# Patient Record
Sex: Female | Born: 2000 | Race: White | Hispanic: No | Marital: Single | Smoking: Never smoker
Health system: Southern US, Community
[De-identification: ages and names within clinical notes are randomized; demographics above are authoritative.]

## PROBLEM LIST (undated history)

## (undated) HISTORY — PX: KNEE SURGERY: SHX244

---
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
## 2019-04-27 ENCOUNTER — Other Ambulatory Visit: Payer: Self-pay

## 2019-04-27 MED ORDER — NEBIVOLOL HCL 10 MG PO TABS: 10.0000 mg | ORAL_TABLET | Freq: Every day | ORAL | 3 refills | Status: DC

## 2019-05-07 ENCOUNTER — Emergency Department
Admission: EM | Admit: 2019-05-07 | Discharge: 2019-05-07 | Disposition: A | Discharge status: Home / Self Care | Payer: BLUE CROSS/BLUE SHIELD | Attending: Emergency Medicine | Admitting: Emergency Medicine

## 2019-05-07 ENCOUNTER — Other Ambulatory Visit: Payer: Self-pay

## 2019-05-07 ENCOUNTER — Encounter: Payer: Self-pay | Admitting: Physician Assistant

## 2019-05-07 DIAGNOSIS — R0600 Dyspnea, unspecified: Secondary | ICD-10-CM | POA: Diagnosis present

## 2019-05-07 DIAGNOSIS — F410 Panic disorder [episodic paroxysmal anxiety] without agoraphobia: Secondary | ICD-10-CM | POA: Insufficient documentation

## 2019-05-07 NOTE — Discharge Instructions (Addendum)
Follow-up with your regular doctor.  Discussed increasing your Lexapro.  Or she may want to add a medication for you to take when you have a panic attack.

## 2019-05-07 NOTE — ED Triage Notes (Signed)
Pt comes via [facility] from home with c/o possible allergic reaction. Pt states she was at her desk at work and began to not feel well. Pt states she became cloudy headed and vision was disturbed. Pt states she has had same symptoms when she had allergic reaction.  Pt denies any recent food consumption prior to incident. Pt states she then got really anxious and thinks she was having a panic attack  Pt states she feel better now and not sure what caused this.  Pt states she took the benadryl when she thought she was having an allergic reaction.

## 2019-05-07 NOTE — ED Provider Notes (Signed)
[facility] Emergency Department Provider Note  ____________________________________________   First MD Initiated Contact with Patient 05/07/19 1620     (approximate)  I have reviewed the triage vital signs and the nursing notes.   HISTORY  Chief Complaint Allergic Reaction    HPI Sheri [name] is a 19 y.o. female presents emergency department via EMS from her workplace.  States she did know she was having allergic reaction or a panic attack.  States she did not feel well, got cloudy headed, went numb all over and had difficulty breathing.  No wheezing or rash.  She states she feels much better now after she took a Benadryl.  Denies any chest pain or shortness of breath.    History reviewed. No pertinent past medical history.  Patient Active Problem List   Diagnosis Date Noted  . Anxiety 07/20/2018  . Paroxysmal tachycardia ([place]) 05/05/2018  . Palpitations 05/05/2018  . Chest discomfort 05/05/2018    Past Surgical History:  Procedure Laterality Date  . KNEE SURGERY Right     Prior to Admission medications   Medication Sig Start Date End Date Taking? Authorizing Provider  Multiple Vitamins-Calcium (ONE-A-DAY WOMENS FORMULA) TABS Take 1 tablet by mouth daily.    [provider]  nebivolol (BYSTOLIC) 10 MG tablet Take 1 tablet (10 mg total) by mouth daily. 04/27/19   Minna Merritts, MD  propranolol (INDERAL) 10 MG tablet TAKE 1 TABLET BY MOUTH 3 (THREE) TIMES DAILY AS NEEDED (AS NEEDED FOR PAROXYSMAL TACHYCARDIA). 07/29/18   Minna Merritts, MD  SPRINTEC 28 0.25-35 MG-MCG tablet Take 1 tablet by mouth as directed. 06/27/18   [provider]    Allergies Blue dyes (parenteral)  History reviewed. No pertinent family history.  Social History Social History   Tobacco Use  . Smoking status: Never Smoker  . Smokeless tobacco: Never Used  Substance Use Topics  . Alcohol use: Not Currently  . Drug use: Not Currently     Review of Systems  Constitutional: No fever/chills Eyes: No visual changes. ENT: No sore throat. Respiratory: Denies cough Cardiovascular: Denies chest pain Gastrointestinal: Denies abdominal pain Genitourinary: Negative for dysuria. Musculoskeletal: Negative for back pain. Skin: Negative for rash. Psychiatric: no mood changes,     ____________________________________________   PHYSICAL EXAM:  VITAL SIGNS: ED Triage Vitals [05/07/19 1428]  Enc Vitals Group     BP 110/61     Pulse Rate 80     Resp 18     Temp 98 F (36.7 C)     Temp src      SpO2 100 %     Weight 110 lb (49.9 kg)     Height 5\' 5"  (1.651 m)     Head Circumference      Peak Flow      Pain Score 0     Pain Loc      Pain Edu?      Excl. in [place]?     Constitutional: Alert and oriented. Well appearing and in no acute distress. Eyes: Conjunctivae are normal.  Head: Atraumatic. Nose: No congestion/rhinnorhea. Mouth/Throat: Mucous membranes are moist.  No oral swelling Neck:  supple no lymphadenopathy noted Cardiovascular: Normal rate, regular rhythm. Heart sounds are normal Respiratory: Normal respiratory effort.  No retractions, lungs c t a  GU: deferred Musculoskeletal: FROM all extremities, warm and well perfused Neurologic:  Normal speech and language.  Skin:  Skin is warm, dry and intact. No rash noted.  No hives  noted Psychiatric: Mood and affect are normal. Speech and behavior are normal.  ____________________________________________   LABS (all labs ordered are listed, but only abnormal results are displayed)  Labs Reviewed - No data to display ____________________________________________   ____________________________________________  RADIOLOGY    ____________________________________________   PROCEDURES  Procedure(s) performed: No  Procedures    ____________________________________________   INITIAL IMPRESSION / ASSESSMENT AND PLAN / ED COURSE  Pertinent labs &  imaging results that were available during my care of the patient were reviewed by me and considered in my medical decision making (see chart for details).   Patient is 19 year old female presents emergency department with a questionable allergic reaction versus panic attack.  See HPI.  On physical exam patient is very stable there is no rash swelling or wheezing.  She does states she feels much better.  At this time I feel that she had a panic attack.  She be discharged with anxiety/panic disorder information.  She is to follow-up with her regular doctor to have her Lexapro increased from 5 mg to 10 mg.  She had also asked them if there is a medication she could take for when she does actually have a panic attack.  She was discharged stable condition    Sheri [name] was evaluated in Emergency Department on 05/07/2019 for the symptoms described in the history of present illness. She was evaluated in the context of the global COVID-19 pandemic, which necessitated consideration that the patient might be at risk for infection with the SARS-CoV-2 virus that causes COVID-19. Institutional protocols and algorithms that pertain to the evaluation of patients at risk for COVID-19 are in a state of rapid change based on information released by regulatory bodies including the CDC and federal and state organizations. These policies and algorithms were followed during the patient's care in the ED.   As part of my medical decision making, I reviewed the following data within the electronic MEDICAL RECORD NUMBER Nursing notes reviewed and incorporated, Old chart reviewed, Notes from prior ED visits and  Controlled Substance Database  ____________________________________________   FINAL CLINICAL IMPRESSION(S) / ED DIAGNOSES  Final diagnoses:  Panic attack      NEW MEDICATIONS STARTED DURING THIS VISIT:  New Prescriptions   No medications on file     Note:  This document was prepared using Dragon voice  recognition software and may include unintentional dictation errors.    Faythe Ghee, PA-C 05/07/19 1945    Emily Filbert, MD 05/07/19 2231

## 2019-07-01 ENCOUNTER — Other Ambulatory Visit: Payer: Self-pay | Admitting: Cardiovascular Disease

## 2019-11-08 ENCOUNTER — Other Ambulatory Visit: Payer: Self-pay | Admitting: Cardiovascular Disease

## 2019-11-16 ENCOUNTER — Ambulatory Visit
Admission: EM | Admit: 2019-11-16 | Discharge: 2019-11-16 | Disposition: A | Discharge status: Home / Self Care | Payer: BLUE CROSS/BLUE SHIELD

## 2019-11-16 ENCOUNTER — Other Ambulatory Visit: Payer: Self-pay

## 2019-11-16 ENCOUNTER — Encounter: Payer: Self-pay | Admitting: Emergency Medicine

## 2019-11-16 DIAGNOSIS — T7840XA Allergy, unspecified, initial encounter: Secondary | ICD-10-CM | POA: Diagnosis not present

## 2019-11-16 MED ORDER — DIPHENHYDRAMINE HCL 25 MG PO CAPS
25.0000 mg | ORAL_CAPSULE | Freq: Four times a day (QID) | ORAL | Status: DC | PRN
  Administered 2019-11-16 (×2): 25 mg via ORAL

## 2019-11-16 MED ORDER — FAMOTIDINE 20 MG PO TABS: 20.0000 mg | ORAL_TABLET | Freq: Two times a day (BID) | ORAL | 0 refills | Status: DC

## 2019-11-16 MED ORDER — DIPHENHYDRAMINE HCL 25 MG PO TABS: 25.0000 mg | ORAL_TABLET | Freq: Four times a day (QID) | ORAL | 0 refills | Status: DC | PRN

## 2019-11-16 MED ORDER — FAMOTIDINE 40 MG/5ML PO SUSR: 40.0000 mg | Freq: Once | ORAL | Status: DC

## 2019-11-16 MED ORDER — FAMOTIDINE 40 MG PO TABS
40.0000 mg | ORAL_TABLET | Freq: Once | ORAL | Status: AC
  Administered 2019-11-16: 40 mg via ORAL

## 2019-11-16 MED ORDER — DEXAMETHASONE 10 MG/ML FOR PEDIATRIC ORAL USE
10.0000 mg | Freq: Once | INTRAMUSCULAR | Status: AC
  Administered 2019-11-16: 10 mg via ORAL

## 2019-11-16 NOTE — Discharge Instructions (Addendum)
Take Benadryl every 6 hours for the next 3 days. Take 40 mg of Pepcid daily for the next 3 days.

## 2019-11-16 NOTE — ED Triage Notes (Signed)
Pt c/o tongue swelling, chest tightness, shaking and dizziness. She states the only thing she did different was drinking a Redbull. She started drinking it about 9am she took a zyrtec about 9:30.

## 2019-11-16 NOTE — ED Provider Notes (Signed)
Emergency Department Provider Note  ____________________________________________  Time seen: Approximately 11:24 AM  I have reviewed the triage vital signs and the nursing notes.   HISTORY  Chief Complaint Allergic Reaction   Historian Patient     HPI Sheri [name] is a 19 y.o. female presents to the emergency department with a sensation of tongue swelling and pain with swallowing after having a red bull.  Patient also states that she has had some mild dizziness.  She denies current chest pain, chest tightness, abdominal pain, nausea, vomiting or diarrhea.  No syncope.  Patient states her symptoms started after she drank a red bull which is atypical for her.  She states that in childhood she had an allergy to blue dye.  No history of anaphylaxis in the past.  She took Allegra at home which has relieved some of her symptoms.   History reviewed. No pertinent past medical history.   Immunizations up to date:  Yes.     History reviewed. No pertinent past medical history.  Patient Active Problem List   Diagnosis Date Noted   Anxiety 07/20/2018   Paroxysmal tachycardia (HCC) 05/05/2018   Palpitations 05/05/2018   Chest discomfort 05/05/2018    Past Surgical History:  Procedure Laterality Date   KNEE SURGERY Right     Prior to Admission medications   Medication Sig Start Date End Date Taking? Authorizing Provider  BYSTOLIC 10 MG tablet TAKE 1 TABLET (10 MG TOTAL) BY MOUTH DAILY. NEED OFFICE VISIT FOR FURTHER REFILLS. THANK YOU! 11/09/19  Yes Gollan, Tollie Pizza, MD  escitalopram (LEXAPRO) 5 MG tablet Take 5 mg by mouth daily. 11/11/19  Yes [provider]  diphenhydrAMINE (BENADRYL) 25 MG tablet Take 1 tablet (25 mg total) by mouth every 6 (six) hours as needed. 11/16/19   Orvil Feil, PA-C  famotidine (PEPCID) 20 MG tablet Take 1 tablet (20 mg total) by mouth 2 (two) times daily for 3 days. 11/16/19 11/19/19  Orvil Feil, PA-C  Multiple  Vitamins-Calcium (ONE-A-DAY WOMENS FORMULA) TABS Take 1 tablet by mouth daily.    [provider]  propranolol (INDERAL) 10 MG tablet TAKE 1 TABLET BY MOUTH 3 (THREE) TIMES DAILY AS NEEDED (AS NEEDED FOR PAROXYSMAL TACHYCARDIA). 07/29/18   Antonieta Iba, MD  SPRINTEC 28 0.25-35 MG-MCG tablet Take 1 tablet by mouth as directed. 06/27/18   [provider]    Allergies Blue dyes (parenteral)  Family History  Problem Relation Age of Onset   Healthy Mother    Healthy Father     Social History Social History   Tobacco Use   Smoking status: Never Smoker   Smokeless tobacco: Never Used  Building services engineer Use: Never used  Substance Use Topics   Alcohol use: Not Currently   Drug use: Not Currently     Review of Systems  Constitutional: No fever/chills Eyes:  No discharge ENT: No upper respiratory complaints. Respiratory: no cough. No SOB/ use of accessory muscles to breath Gastrointestinal:   No nausea, no vomiting.  No diarrhea.  No constipation. Musculoskeletal: Negative for musculoskeletal pain. Skin: Negative for rash, abrasions, lacerations, ecchymosis.    ____________________________________________   PHYSICAL EXAM:  VITAL SIGNS: ED Triage Vitals  Enc Vitals Group     BP 11/16/19 1114 119/70     Pulse Rate 11/16/19 1114 60     Resp 11/16/19 1114 18     Temp 11/16/19 1114 99.2 F (37.3 C)     Temp Source 11/16/19  1114 Oral     SpO2 11/16/19 1114 100 %     Weight 11/16/19 1112 110 lb 0.2 oz (49.9 kg)     Height 11/16/19 1112 5\' 5"  (1.651 m)     Head Circumference --      Peak Flow --      Pain Score 11/16/19 1111 3     Pain Loc --      Pain Edu? --      Excl. in GC? --      Constitutional: Patient is resting comfortably on exam table in no apparent distress. Eyes: Conjunctivae are normal. PERRL. EOMI. Head: Atraumatic. ENT:      Ears: TMs are pearly.      Nose: No congestion/rhinnorhea.      Mouth/Throat: Mucous membranes  are moist.  Neck: No stridor.  No cervical spine tenderness to palpation. Cardiovascular: Normal rate, regular rhythm. Normal S1 and S2.  Good peripheral circulation. Respiratory: Normal respiratory effort without tachypnea or retractions. Lungs CTAB. Good air entry to the bases with no decreased or absent breath sounds Gastrointestinal: Bowel sounds x 4 quadrants. Soft and nontender to palpation. No guarding or rigidity. No distention. Musculoskeletal: Full range of motion to all extremities. No obvious deformities noted Neurologic:  Normal for age. No gross focal neurologic deficits are appreciated.  Skin:  Skin is warm, dry and intact. No rash noted. Psychiatric: Mood and affect are normal for age. Speech and behavior are normal.   ____________________________________________   LABS (all labs ordered are listed, but only abnormal results are displayed)  Labs Reviewed - No data to display ____________________________________________  EKG   ____________________________________________  RADIOLOGY   No results found.  ____________________________________________    PROCEDURES  Procedure(s) performed:     Procedures     Medications  diphenhydrAMINE (BENADRYL) capsule 25 mg (25 mg Oral Given 11/16/19 1132)  dexamethasone (DECADRON) 10 MG/ML injection for Pediatric ORAL use 10 mg (10 mg Oral Given 11/16/19 1131)  famotidine (PEPCID) tablet 40 mg (40 mg Oral Given 11/16/19 1134)     ____________________________________________   INITIAL IMPRESSION / ASSESSMENT AND PLAN / ED COURSE  Pertinent labs & imaging results that were available during my care of the patient were reviewed by me and considered in my medical decision making (see chart for details).      Assessment and plan Allergic reaction:  19 year old female presents to the emergency department with perceived tongue swelling and pain with swallowing.  Vital signs were reassuring at triage.  On physical  exam, patient was sitting with no apparent distress.  She was managing her own secretions and able to speak in complete sentences.  Airway was patent.  Will administer Decadron, Benadryl and famotidine and observe in urgent care and will reassess.  Patient reported that her symptoms completely resolved after aforementioned medications were administered.  Recommended continuing Benadryl and Pepcid daily for the next 2 to 3 days.  Return precautions were given.    ____________________________________________  FINAL CLINICAL IMPRESSION(S) / ED DIAGNOSES  Final diagnoses:  Allergic reaction, initial encounter      NEW MEDICATIONS STARTED DURING THIS VISIT:  ED Discharge Orders         Ordered    famotidine (PEPCID) 20 MG tablet  2 times daily        11/16/19 1300    diphenhydrAMINE (BENADRYL) 25 MG tablet  Every 6 hours PRN        11/16/19 1300  This chart was dictated using voice recognition software/Dragon. Despite best efforts to proofread, errors can occur which can change the meaning. Any change was purely unintentional.     Orvil Feil, PA-C 11/16/19 1315

## 2019-11-16 NOTE — ED Notes (Signed)
Checked on patient. She states she is feeling better after medications.

## 2019-11-23 ENCOUNTER — Other Ambulatory Visit: Payer: Self-pay | Admitting: Cardiovascular Disease

## 2019-11-23 NOTE — Telephone Encounter (Signed)
Scheduled for Friday with Dan Humphreys

## 2019-11-23 NOTE — Telephone Encounter (Signed)
Please schedule office visit with Dr. Mariah Milling or defer refills to PCP. Last seen 08/2018. Thank you!

## 2019-11-27 ENCOUNTER — Ambulatory Visit: Payer: BLUE CROSS/BLUE SHIELD | Admitting: Family

## 2019-11-27 NOTE — Progress Notes (Deleted)
   Office Visit    Patient Name: Sheri [name] Date of Encounter: 11/27/2019  Primary Care Provider:  [facility] Primary Cardiologist:  No primary care provider on file. Electrophysiologist:  None   Chief Complaint    Sheri [name] is a 19 y.o. female with a hx of *** presents today for ***   Past Medical History    No past medical history on file. Past Surgical History:  Procedure Laterality Date  . KNEE SURGERY Right     Allergies  Allergies  Allergen Reactions  . Blue Dyes (Parenteral) Other (See Comments)    Unknown -- possibly outgrew     History of Present Illness    Sheri [name] is a 19 y.o. female with a hx of *** last seen 08/26/18 by Dr. Mariah Milling.  Seen in clinic 08/26/18. SHe was recommended to continue Bystolic 10mg  daily. Propranolol was deferred as it previously lowered her blood pressure. She was improving from eating disorder and slowly regaining weight.   EKGs/Labs/Other Studies Reviewed:   The following studies were reviewed today: ***  EKG:  EKG is *** ordered today.  The ekg ordered today demonstrates ***  Recent Labs: No results found for requested labs within last 8760 hours.  Recent Lipid Panel No results found for: CHOL, TRIG, HDL, CHOLHDL, VLDL, LDLCALC, LDLDIRECT  Risk Assessment/Calculations:  {Does this patient have ATRIAL FIBRILLATION?:[phone]}  Home Medications   No outpatient medications have been marked as taking for the 11/27/19 encounter (Appointment) with 11/29/19, NP.     Review of Systems   ***   ROS All other systems reviewed and are otherwise negative except as noted above.  Physical Exam    VS:  LMP 11/02/2019  , BMI There is no height or weight on file to calculate BMI.  Wt Readings from Last 3 Encounters:  11/16/19 110 lb 0.2 oz (49.9 kg)  05/07/19 110 lb (49.9 kg) (15 %, Z= -1.02)*  08/26/18 120 lb (54.4 kg) (38 %, Z= -0.31)*   * Growth percentiles are based on CDC (Girls,  2-20 Years) data.     GEN: Well nourished, well developed, in no acute distress. HEENT: normal. Neck: Supple, no JVD, carotid bruits, or masses. Cardiac: ***RRR, no murmurs, rubs, or gallops. No clubbing, cyanosis, edema.  ***Radials/DP/PT 2+ and equal bilaterally.  Respiratory:  ***Respirations regular and unlabored, clear to auscultation bilaterally. GI: Soft, nontender, nondistended. MS: No deformity or atrophy. Skin: Warm and dry, no rash. Neuro:  Strength and sensation are intact. Psych: Normal affect.  Assessment & Plan    1. ***  Disposition: Follow up {follow up:15908} with ***   Signed, 08/28/18, NP 11/27/2019, 7:51 AM [facility]

## 2019-11-30 ENCOUNTER — Encounter: Payer: Self-pay | Admitting: Family

## 2019-12-22 ENCOUNTER — Other Ambulatory Visit: Payer: Self-pay | Admitting: Cardiovascular Disease

## 2019-12-29 ENCOUNTER — Other Ambulatory Visit: Payer: Self-pay | Admitting: Cardiovascular Disease

## 2019-12-29 NOTE — Telephone Encounter (Signed)
Pt overdue for 12 month f/u.  Pt needing refills. Please contact pt for future appointment last seen 08/2018.

## 2019-12-29 NOTE — Telephone Encounter (Signed)
LVM for patient to schedule.

## 2020-01-04 NOTE — Telephone Encounter (Signed)
Attempted to schedule.  LMOV to call office.  ° °

## 2020-01-05 NOTE — Telephone Encounter (Signed)
LVM for patient to call back and schedule  Encounter being closed after 3 attempts of calling

## 2020-01-25 ENCOUNTER — Other Ambulatory Visit: Payer: Self-pay | Admitting: Cardiovascular Disease

## 2020-03-17 NOTE — Progress Notes (Signed)
Cardiology Office Note  Date:  03/18/2020   ID:  Sheri [name], DOB 08-13-2000, MRN [id number]  PCP:  [facility]   Chief Complaint  Patient presents with  . Annual Exam    Pt states doing well from cardiac standpoint  History of palpitations  HPI:  [name] is a 20 year old woman with past medical history of Tachycardia palpitations Who presents for routine follow-up of her palpitations symptoms   seen before by Caleen Essex, MD for similar symptoms.  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 Reports having significant Food allergies Lots of bloating after eating Tries to eat a normal diet but has to restrict herself at times, avoid certain foods  Had weight loss 104 pounds, after following diet recommended by her aunt Better now, weight up  No shortness of breath, palpitations well controlled on beta-blocker, Orthostasis symptoms are well controlled, rare episodes  EKG personally reviewed by myself on todays visit NSR rate 77 noST or T wave changes  Other past medical history reviewed  tachycardia symptoms started in elementary school, rare at that time middle school (2x episodes), getting worse over the years  Prior syncope Paroxysmal tach: 140-160 at rest Sx in class, has tightness, dizzy, SOB  one episode when she was at home, heart was going very fast had to lay down on the ground, almost felt paralyzed  ZIO monitor 05/2018 variable heart rate 60 up to 174 bpm Average heart rate 95 bpm There were 20 triggered events and these were associated with sinus rhythm 3 diary events fluttering racing pounding lightheadedness dizziness chest pain pressure all correlating with sinus rhythm There were APCs and PVCs noted  In terms of family history: Mother has long history of paroxysmal tachycardia treated with bystolic daily  and propranolol as needed   Prior CV studies:  The  following studies were reviewed today: Normal echo 03/27/2017 Prior outside records reviewed from [facility]  Past medical history Tachycardia, palpitations  PSH:    Past Surgical History:  Procedure Laterality Date  . KNEE SURGERY Right     Current Outpatient Medications  Medication Sig Dispense Refill  . nebivolol (BYSTOLIC) 10 MG tablet Take 1 tablet (10 mg total) by mouth daily. 90 tablet 3  . SPRINTEC 28 0.25-35 MG-MCG tablet Take 1 tablet by mouth as directed. (Patient not taking: Reported on 03/18/2020)     No current facility-administered medications for this visit.     Allergies:   Blue dyes (parenteral)   Social History:  The patient  reports that she has never smoked. She has never used smokeless tobacco. She reports previous alcohol use. She reports previous drug use.   Family History:   family history includes Healthy in her father and mother.    Review of Systems: Review of Systems  Constitutional: Negative.   HENT: Negative.   Respiratory: Negative.   Cardiovascular: Negative.        Chest tightness  Gastrointestinal: Negative.   Musculoskeletal: Negative.   Neurological: Positive for dizziness.  Psychiatric/Behavioral: Negative.   All other systems reviewed and are negative.    PHYSICAL EXAM: VS:  BP 116/64   Pulse 77   Ht 5\' 5"  (1.651 m)   Wt 125 lb (56.7 kg)   BMI 20.80 kg/m  , BMI Body mass index is 20.8 kg/m. Constitutional:  oriented to person, place, and time. No distress.  HENT:  Head: Grossly normal Eyes:  no discharge.  No scleral icterus.  Neck: No JVD, no carotid bruits  Cardiovascular: Regular rate and rhythm, no murmurs appreciated Pulmonary/Chest: Clear to auscultation bilaterally, no wheezes or rails Abdominal: Soft.  no distension.  no tenderness.  Musculoskeletal: Normal range of motion Neurological:  normal muscle tone. Coordination normal. No atrophy Skin: Skin warm and dry Psychiatric: normal affect,  pleasant   Recent Labs: No results found for requested labs within last 8760 hours.    Lipid Panel No results found for: CHOL, HDL, LDLCALC, TRIG    Wt Readings from Last 3 Encounters:  03/18/20 125 lb (56.7 kg)  11/16/19 110 lb 0.2 oz (49.9 kg)  05/07/19 110 lb (49.9 kg) (15 %, Z= -1.02)*   * Growth percentiles are based on CDC (Girls, 2-20 Years) data.     ASSESSMENT AND PLAN:  Problem List Items Addressed This Visit      Cardiology Problems   Paroxysmal tachycardia (HCC) - Primary   Relevant Medications   nebivolol (BYSTOLIC) 10 MG tablet   Other Relevant Orders   EKG 12-Lead     Other   Anxiety   Palpitations   Chest discomfort     Paroxysmal tachycardia/palpitations Doing well on Bystolic 10 mg daily Blood pressure stable, no changes to the medications, refilled No further testing needed  Allergies/motility disorder Has been seen by GI in the past    Total encounter time more than 25 minutes  Greater than 50% was spent in counseling and coordination of care with the patient    Signed, Dossie Arbour, M.D., Ph.D. [facility] [place], [place] [phone]

## 2020-03-18 ENCOUNTER — Other Ambulatory Visit: Payer: Self-pay

## 2020-03-18 ENCOUNTER — Encounter: Payer: Self-pay | Admitting: Cardiovascular Disease

## 2020-03-18 ENCOUNTER — Ambulatory Visit: Payer: BLUE CROSS/BLUE SHIELD | Admitting: Cardiovascular Disease

## 2020-03-18 VITALS — BP 116/64 | HR 77 | Ht 65.0 in | Wt 125.0 lb

## 2020-03-18 DIAGNOSIS — I479 Paroxysmal tachycardia, unspecified: Secondary | ICD-10-CM | POA: Diagnosis not present

## 2020-03-18 DIAGNOSIS — R002 Palpitations: Secondary | ICD-10-CM

## 2020-03-18 DIAGNOSIS — F419 Anxiety disorder, unspecified: Secondary | ICD-10-CM | POA: Diagnosis not present

## 2020-03-18 DIAGNOSIS — R0789 Other chest pain: Secondary | ICD-10-CM | POA: Diagnosis not present

## 2020-03-18 MED ORDER — NEBIVOLOL HCL 10 MG PO TABS: 10.0000 mg | ORAL_TABLET | Freq: Every day | ORAL | 3 refills | Status: DC

## 2020-03-18 NOTE — Patient Instructions (Signed)
Medication Instructions:  No changes  If you need a refill on your cardiac medications before your next appointment, please call your pharmacy.    Lab work: No new labs needed   If you have labs (blood work) drawn today and your tests are completely normal, you will receive your results only by: . MyChart Message (if you have MyChart) OR . A paper copy in the mail If you have any lab test that is abnormal or we need to change your treatment, we will call you to review the results.   Testing/Procedures: No new testing needed   Follow-Up: At [facility], you and your health needs are our priority.  As part of our continuing mission to provide you with exceptional heart care, we have created designated Provider Care Teams.  These Care Teams include your primary Cardiologist (physician) and Advanced Practice Providers (APPs -  Physician Assistants and Nurse Practitioners) who all work together to provide you with the care you need, when you need it.  . You will need a follow up appointment as needed  . Providers on your designated Care Team:   . Christopher Berge, NP . Ryan Dunn, PA-C . Jacquelyn Visser, PA-C  Any Other Special Instructions Will Be Listed Below (If Applicable).  COVID-19 Vaccine Information can be found at: [email or web address] For questions related to vaccine distribution or appointments, please email [email or web address] or call [phone].     

## 2020-03-31 ENCOUNTER — Telehealth: Payer: Self-pay | Admitting: Cardiovascular Disease

## 2020-03-31 MED ORDER — NEBIVOLOL HCL 10 MG PO TABS: 10.0000 mg | ORAL_TABLET | Freq: Every day | ORAL | 3 refills | Status: DC

## 2020-03-31 NOTE — Telephone Encounter (Signed)
Received fax from [facility] requesting refills for Nebivolol 10 mg. Last refill was sent to [facility]. Rx request sent to pharmacy.

## 2020-12-24 ENCOUNTER — Telehealth: Payer: BLUE CROSS/BLUE SHIELD | Admitting: Nurse Practitioner

## 2020-12-24 DIAGNOSIS — J40 Bronchitis, not specified as acute or chronic: Secondary | ICD-10-CM

## 2020-12-24 MED ORDER — AZITHROMYCIN 250 MG PO TABS: ORAL_TABLET | ORAL | 0 refills | Status: DC

## 2020-12-24 MED ORDER — ALBUTEROL SULFATE HFA 108 (90 BASE) MCG/ACT IN AERS: 2.0000 | INHALATION_SPRAY | Freq: Four times a day (QID) | RESPIRATORY_TRACT | 0 refills | Status: DC | PRN

## 2020-12-24 NOTE — Patient Instructions (Signed)

## 2020-12-24 NOTE — Progress Notes (Signed)
Virtual Visit Consent   EMOJEAN GERTZ, you are scheduled for a virtual visit with Mary-Margaret Daphine Deutscher, FNP, a [facility] provider, today.     Just as with appointments in the office, your consent must be obtained to participate.  Your consent will be active for this visit and any virtual visit you may have with one of our providers in the next 365 days.     If you have a MyChart account, a copy of this consent can be sent to you electronically.  All virtual visits are billed to your insurance company just like a traditional visit in the office.    As this is a virtual visit, video technology does not allow for your provider to perform a traditional examination.  This may limit your provider's ability to fully assess your condition.  If your provider identifies any concerns that need to be evaluated in person or the need to arrange testing (such as labs, EKG, etc.), we will make arrangements to do so.     Although advances in technology are sophisticated, we cannot ensure that it will always work on either your end or our end.  If the connection with a video visit is poor, the visit may have to be switched to a telephone visit.  With either a video or telephone visit, we are not always able to ensure that we have a secure connection.     I need to obtain your verbal consent now.   Are you willing to proceed with your visit today? YES   KERRYN TENNANT has provided verbal consent on 12/24/2020 for a virtual visit (video or telephone).   Mary-Margaret Daphine Deutscher, FNP   Date: 12/24/2020 8:16 AM   Virtual Visit via Video Note   I, Mary-Margaret Daphine Deutscher, connected with BEVELY HACKBART ([id number], 02-May-2000) on 12/24/20 at  8:15 AM EST by a video-enabled telemedicine application and verified that I am speaking with the correct person using two identifiers.  Location: Patient: Virtual Visit Location Patient: Home Provider: Virtual Visit Location Provider: Mobile   I discussed the  limitations of evaluation and management by telemedicine and the availability of in person appointments. The patient expressed understanding and agreed to proceed.    History of Present Illness: Sheri [name] is a 20 y.o. who identifies as a female who was assigned female at birth, and is being seen today for cough .  HPI: Patient states that 12/20/20 she went to doctor with cold symptoms. She was tested for flu, covid and RSV. They were all negative. She was given tessalon perles and flonase. She was getting no better. She went back pn Thursday with same cough and congestion and she was given steroids and promethazine cough meds. She actually feels worse. Cough is keeping her up all night. Patient has constant dry cough during visit.   Problems:  Patient Active Problem List   Diagnosis Date Noted   Anxiety 07/20/2018   Paroxysmal tachycardia (HCC) 05/05/2018   Palpitations 05/05/2018   Chest discomfort 05/05/2018    Allergies:  Allergies  Allergen Reactions   Blue Dyes (Parenteral) Other (See Comments)    Unknown -- possibly outgrew    Medications:  Current Outpatient Medications:    nebivolol (BYSTOLIC) 10 MG tablet, Take 1 tablet (10 mg total) by mouth daily., Disp: 90 tablet, Rfl: 3   SPRINTEC 28 0.25-35 MG-MCG tablet, Take 1 tablet by mouth as directed. (Patient not taking: Reported on 03/18/2020), Disp: , Rfl:   Observations/Objective: Patient  is well-developed, well-nourished in no acute distress.  Resting comfortably  at home.  Head is normocephalic, atraumatic.  No labored breathing.  Speech is clear and coherent with logical content.  Patient is alert and oriented at baseline.  Constant dry cough during visit  Assessment and Plan:  Corinna Gab in today with chief complaint of URI   1. Bronchitis Patient has 10 10ng prednisone tablets remaining.- she was told to take 8 now which would equal a 80mg  IM injection- which is what  would ave given herin the  office. Forcre fluids Humidifier Continue cough meds Hot showers with steam Explained that cannot do codeine cough meds in video visit Meds ordered this encounter  Medications   azithromycin (ZITHROMAX Z-PAK) 250 MG tablet    Sig: As directed    Dispense:  6 tablet    Refill:  0    Order Specific Question:   Supervising Provider    Answer:   , BRIAN [[id number]]   albuterol (VENTOLIN HFA) 108 (90 Base) MCG/ACT inhaler    Sig: Inhale 2 puffs into the lungs every 6 (six) hours as needed for wheezing or shortness of breath.    Dispense:  8 g    Refill:  0    Order Specific Question:   Supervising Provider    Answer:   Hyacinth Meeker [[id number]]     Follow Up Instructions: I discussed the assessment and treatment plan with the patient. The patient was provided an opportunity to ask questions and all were answered. The patient agreed with the plan and demonstrated an understanding of the instructions.  A copy of instructions were sent to the patient via MyChart.  The patient was advised to call back or seek an in-person evaluation if the symptoms worsen or if the condition fails to improve as anticipated.  Time:  I spent 15 minutes with the patient via telehealth technology discussing the above problems/concerns.    Mary-Margaret Eber Hong, FNP

## 2021-01-01 NOTE — Progress Notes (Signed)
Cardiology Office Note  Date:  01/02/2021   ID:  Sheri [name], DOB 09/26/2000, MRN [id number]  PCP:  Sheri [name]   Chief Complaint  Patient presents with   Follow up     Patient c/o tachycardia. Medications reviewed by the patient verbally.    HPI:  [name] is a 20 year old woman with past medical history of Tachycardia palpitations Who presents for routine follow-up of her palpitations symptoms  previously seen before by Sheri Essex, MD for similar symptoms.  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 at [employer]: Working 2 jobs, 70 hours a week Continues to do her body building, weight stable Trying to gain more muscle Cut out cardio, 10 min of stairs typically Has history of Food allergies  h.pylori infection resolved Off propranolol , BP low Pressure 90/50 Does have episodes of orthostasis, drinks water, has learned techniques to avoid passing out, will sit down on the ground, flexes abdominal muscles  Weight stable 122  On bystolic 10 mg daily On adderall  Orthostatics performed today, blood pressure ranging 10 7-1 11, worst with standing 103 systolic heart rates high 70-80  EKG personally reviewed by myself on todays visit NSR rate 76 no ST or T wave changes  Other past medical history reviewed  tachycardia symptoms started in elementary school, rare at that time middle school (2x episodes), getting worse over the years  Prior syncope Paroxysmal tach: 140-160 at rest Sx in class, has tightness, dizzy, SOB  one episode when she was at home, heart was going very fast had to lay down on the ground, almost felt paralyzed   ZIO monitor 05/2018 variable heart rate 60 up to 174 bpm Average heart rate 95 bpm There were 20 triggered events and these were associated with sinus rhythm 3 diary events fluttering racing pounding lightheadedness dizziness chest pain pressure all correlating with sinus rhythm There  were APCs and PVCs noted   In terms of family history: Mother has long history of paroxysmal tachycardia treated with bystolic daily  and propranolol as needed    Prior CV studies:   The following studies were reviewed today: Normal echo 03/27/2017 Prior outside records reviewed from [facility]   Past medical history Tachycardia, palpitations   PSH:    Past Surgical History:  Procedure Laterality Date   KNEE SURGERY Right     Current Outpatient Medications  Medication Sig Dispense Refill   amphetamine-dextroamphetamine (ADDERALL XR) 20 MG 24 hr capsule Take 20 mg by mouth every morning.     nebivolol (BYSTOLIC) 10 MG tablet Take 1 tablet (10 mg total) by mouth daily. 90 tablet 3   progesterone (PROMETRIUM) 100 MG capsule Take 100 mg by mouth at bedtime.     albuterol (VENTOLIN HFA) 108 (90 Base) MCG/ACT inhaler Inhale 2 puffs into the lungs every 6 (six) hours as needed for wheezing or shortness of breath. (Patient not taking: Reported on 01/02/2021) 8 g 0   azithromycin (ZITHROMAX Z-PAK) 250 MG tablet As directed (Patient not taking: Reported on 01/02/2021) 6 tablet 0   SPRINTEC 28 0.25-35 MG-MCG tablet Take 1 tablet by mouth as directed. (Patient not taking: Reported on 03/18/2020)     No current facility-administered medications for this visit.     Allergies:   Other; Whey; Lac bovis; Albumen, egg; Blue dyes (parenteral); Corn oil; Crab extract allergy skin test; Egg yolk; Gluten meal; Gramineae pollens; Monascus purpureus went yeast; Mushroom extract complex; Oat grain extract  allergy skin test; Olive oil; Onion; Pineapple; Rice; Shiitake mushroom; Strawberry extract; Sugar-protein-starch; Wheat bran; and Yeast   Social History:  The patient  reports that she has never smoked. She has never used smokeless tobacco. She reports that she does not currently use alcohol. She reports that she does not currently use drugs.   Family History:   family history includes Healthy in her  father and mother.    Review of Systems: Review of Systems  Constitutional: Negative.   HENT: Negative.    Respiratory: Negative.    Cardiovascular: Negative.   Gastrointestinal: Negative.   Musculoskeletal: Negative.   Neurological:  Positive for dizziness.  Psychiatric/Behavioral: Negative.    All other systems reviewed and are negative.  PHYSICAL EXAM: VS:  BP (!) 90/50 (BP Location: Left Arm, Patient Position: Sitting, Cuff Size: Normal)   Pulse 76   Ht 5\' 5"  (1.651 m)   Wt 122 lb 2 oz (55.4 kg)   SpO2 98%   BMI 20.32 kg/m  , BMI Body mass index is 20.32 kg/m. Constitutional:  oriented to person, place, and time. No distress.  HENT:  Head: Grossly normal Eyes:  no discharge. No scleral icterus.  Neck: No JVD, no carotid bruits  Cardiovascular: Regular rate and rhythm, no murmurs appreciated Pulmonary/Chest: Clear to auscultation bilaterally, no wheezes or rails Abdominal: Soft.  no distension.  no tenderness.  Musculoskeletal: Normal range of motion Neurological:  normal muscle tone. Coordination normal. No atrophy Skin: Skin warm and dry Psychiatric: normal affect, pleasant   Recent Labs: No results found for requested labs within last 8760 hours.    Lipid Panel No results found for: CHOL, HDL, LDLCALC, TRIG    Wt Readings from Last 3 Encounters:  01/02/21 122 lb 2 oz (55.4 kg)  03/18/20 125 lb (56.7 kg)  11/16/19 110 lb 0.2 oz (49.9 kg)     ASSESSMENT AND PLAN:  Problem List Items Addressed This Visit       Cardiology Problems   Paroxysmal tachycardia (HCC) - Primary     Other   Anxiety   Palpitations   Chest discomfort   Paroxysmal tachycardia/palpitations On Bystolic 5-10 mg daily Blood pressure low, unable to titrate Off propranolol secondary to low blood pressure No further testing needed Stressed importance of hydration, Compressive clothing does not appear to be very effective for her  Allergies/motility disorder Has been seen by  GI in the past Reports history of H. pylori   Total encounter time more than 25 minutes  Greater than 50% was spent in counseling and coordination of [name] with the patient    Signed, 01/16/20, M.D., Ph.D. [facility] [place], [place]

## 2021-01-02 ENCOUNTER — Other Ambulatory Visit: Payer: Self-pay

## 2021-01-02 ENCOUNTER — Ambulatory Visit: Payer: BLUE CROSS/BLUE SHIELD | Admitting: Cardiovascular Disease

## 2021-01-02 ENCOUNTER — Encounter: Payer: Self-pay | Admitting: Cardiovascular Disease

## 2021-01-02 VITALS — BP 90/50 | HR 76 | Ht 65.0 in | Wt 122.1 lb

## 2021-01-02 DIAGNOSIS — R0789 Other chest pain: Secondary | ICD-10-CM | POA: Diagnosis not present

## 2021-01-02 DIAGNOSIS — I479 Paroxysmal tachycardia, unspecified: Secondary | ICD-10-CM | POA: Diagnosis not present

## 2021-01-02 DIAGNOSIS — F419 Anxiety disorder, unspecified: Secondary | ICD-10-CM | POA: Diagnosis not present

## 2021-01-02 DIAGNOSIS — R002 Palpitations: Secondary | ICD-10-CM | POA: Diagnosis not present

## 2021-01-02 NOTE — Patient Instructions (Addendum)
Medication Instructions:  No changes  If you need a refill on your cardiac medications before your next appointment, please call your pharmacy.   Lab work: No new labs needed  Testing/Procedures: No new testing needed  Follow-Up: At [facility], you and your health needs are our priority.  As part of our continuing mission to provide you with exceptional heart care, we have created designated Provider Care Teams.  These Care Teams include your primary Cardiologist (physician) and Advanced Practice Providers (APPs -  Physician Assistants and Nurse Practitioners) who all work together to provide you with the care you need, when you need it.  You will need a follow up appointment in 12 months  Providers on your designated Care Team:   Christopher Berge, NP Ryan Dunn, PA-C Cadence Furth, PA-C  COVID-19 Vaccine Information can be found at: [email or web address] For questions related to vaccine distribution or appointments, please email [email or web address] or call [phone].   

## 2021-01-15 ENCOUNTER — Other Ambulatory Visit: Payer: Self-pay | Admitting: Nurse Practitioner

## 2021-01-20 IMAGING — DX PORTABLE CHEST - 1 VIEW
1 series · 1 of 1 positions shown · non-contrast
Comparison: March 21, 2017

CLINICAL DATA: Lethargy.  Shortness of breath

EXAM:
PORTABLE CHEST 1 VIEW

[chest ap]
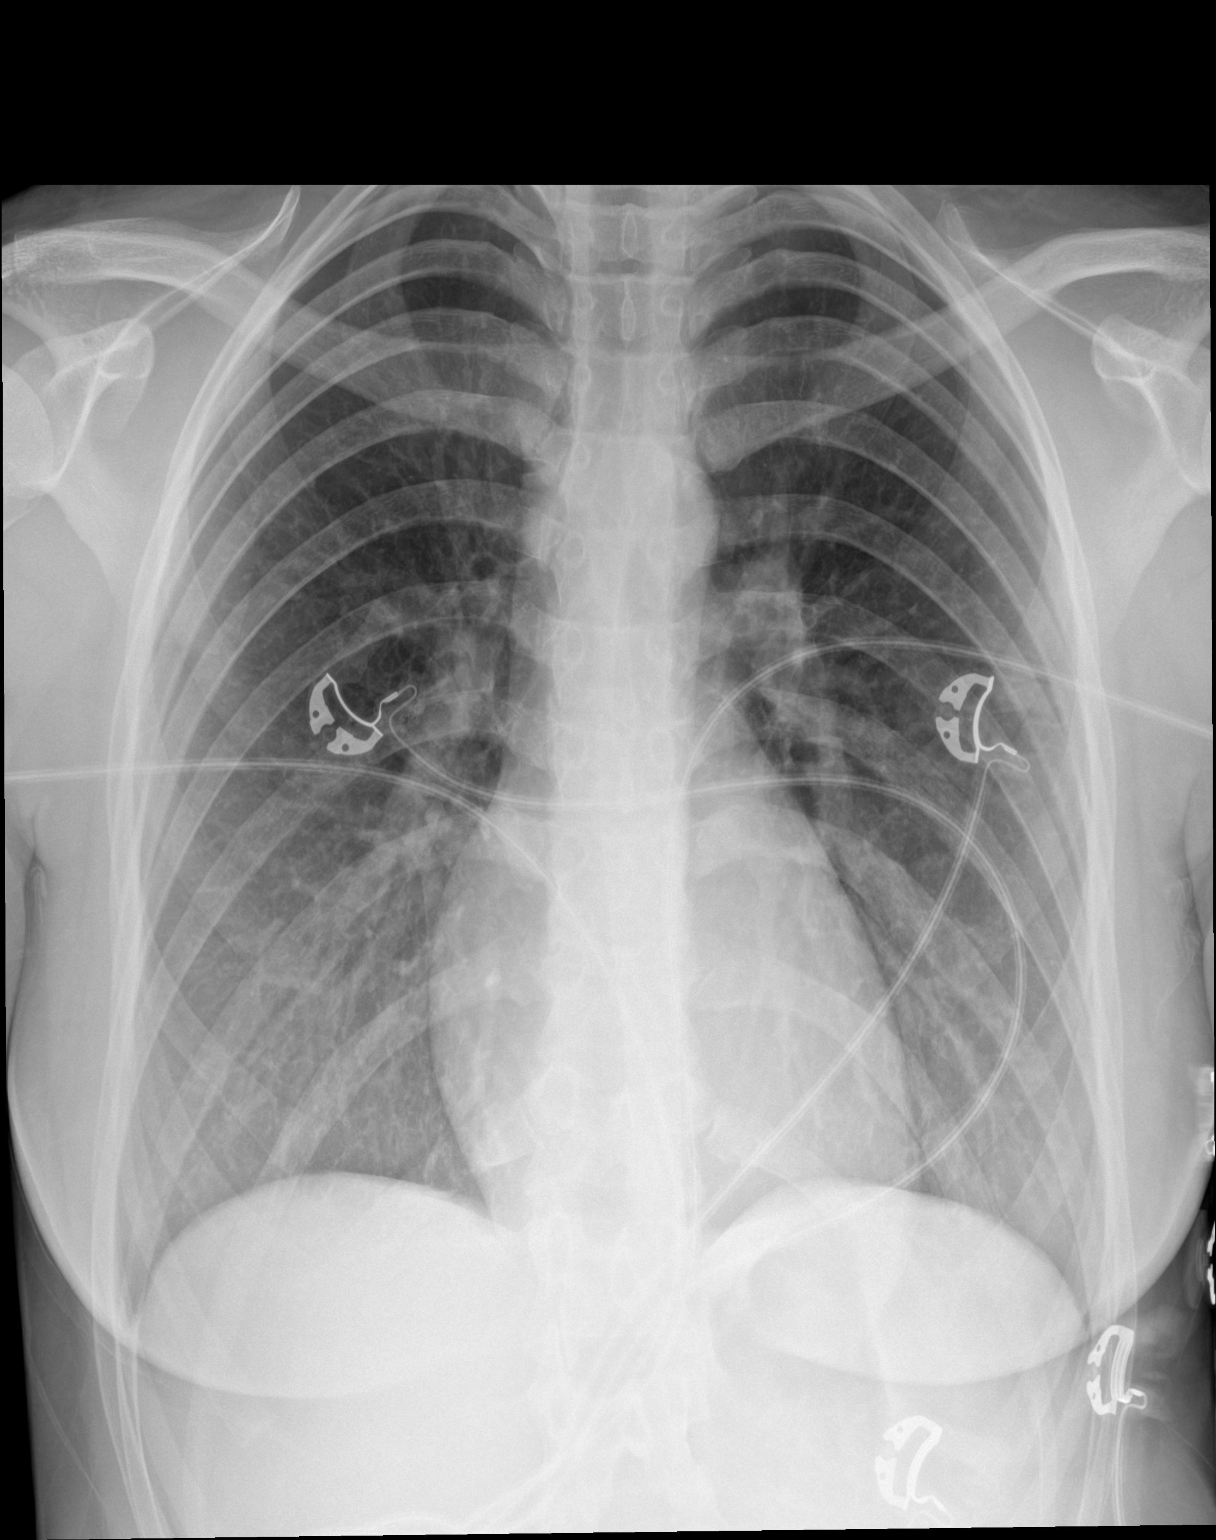

[1 of 1 positions shown; findings below may reference images not displayed]

FINDINGS: Lungs are clear. Heart size and pulmonary vascularity are normal. No
adenopathy. No bone lesions.
IMPRESSION: No edema or consolidation.

## 2021-02-04 ENCOUNTER — Ambulatory Visit: Payer: Self-pay

## 2021-04-06 ENCOUNTER — Other Ambulatory Visit: Payer: Self-pay | Admitting: Cardiovascular Disease

## 2021-07-04 ENCOUNTER — Emergency Department (HOSPITAL_COMMUNITY): Payer: BLUE CROSS/BLUE SHIELD

## 2021-07-04 ENCOUNTER — Emergency Department (HOSPITAL_COMMUNITY)
Admission: EM | Admit: 2021-07-04 | Discharge: 2021-07-04 | Disposition: A | Discharge status: Home / Self Care | Payer: BLUE CROSS/BLUE SHIELD | Attending: Emergency Medicine | Admitting: Emergency Medicine

## 2021-07-04 ENCOUNTER — Other Ambulatory Visit: Payer: Self-pay

## 2021-07-04 DIAGNOSIS — Y92410 Unspecified street and highway as the place of occurrence of the external cause: Secondary | ICD-10-CM | POA: Insufficient documentation

## 2021-07-04 DIAGNOSIS — M25552 Pain in left hip: Secondary | ICD-10-CM | POA: Insufficient documentation

## 2021-07-04 LAB — PREGNANCY, URINE: Preg Test, Ur: NEGATIVE

## 2021-07-04 MED ORDER — METHOCARBAMOL 500 MG PO TABS
500.0000 mg | ORAL_TABLET | Freq: Once | ORAL | Status: AC
  Administered 2021-07-04: 500 mg via ORAL
  Filled 2021-07-04: qty 1

## 2021-07-04 MED ORDER — METHOCARBAMOL 500 MG PO TABS: 500.0000 mg | ORAL_TABLET | Freq: Two times a day (BID) | ORAL | 0 refills | Status: DC
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
## 2021-07-04 NOTE — ED Notes (Signed)
Urine requested  ?
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
## 2021-09-30 NOTE — Progress Notes (Unsigned)
Cardiology Office Note  Date:  10/03/2021   ID:  Sheri [name], DOB [birth year], MRN [id number]  PCP:  [facility]   Chief Complaint  Patient presents with   12 month follow up     Patient c/o feels exhausted daily, palpitations, decrease blood pressure, dizziness and difficulty breathing. Medications reviewed by the patient verbally.    HPI:  [name] is a 21 year old woman with past medical history of Tachycardia palpitations Who presents for routine follow-up of her palpitations symptoms previously seen before by Sheri Essex, MD for similar symptoms.  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/22 On Bystolic 5-10 mg daily  Currently participating in [employer] school On her feet for long hours Having more symptoms, More head rushes, looking down to looking up Chronic fatigue Allegies worse,  Previously discussed gaining weight to help her blood pressure, having trouble gaining weight despite her best efforts Reports does not eat much for breakfast  Continues to have low blood pressures, orthostasis symptoms  Orthostatics performed today  96/50 supine, 110/60 sitting, 90/60 standing and no change after 3 minutes heart rates in the 70s  EKG personally reviewed by myself on todays visit NSR rate 74 no ST or T wave changes  Other past medical history reviewed  tachycardia symptoms started in elementary school, rare at that time middle school (2x episodes), getting worse over the years  Prior syncope Paroxysmal tach: 140-160 at rest Sx in class, has tightness, dizzy, SOB  one episode when she was at home, heart was going very fast had to lay down on the ground, almost felt paralyzed   ZIO monitor 05/2018 variable heart rate 60 up to 174 bpm Average heart rate 95 bpm There were 20 triggered events and these were associated with sinus rhythm 3 diary events fluttering racing pounding lightheadedness dizziness chest pain pressure all correlating with sinus  rhythm There were APCs and PVCs noted   In terms of family history: Mother has long history of paroxysmal tachycardia treated with bystolic daily  and propranolol as needed   Prior CV studies:   The following studies were reviewed today: Normal echo 03/27/2017 Prior outside [facility] from [facility]   Past medical history Tachycardia, palpitations   PSH:    Past Surgical History:  Procedure Laterality Date   KNEE SURGERY Right     Current Outpatient Medications  Medication Sig Dispense Refill   nebivolol (BYSTOLIC) 10 MG tablet TAKE 1 TABLET DAILY 90 tablet 1   SPRINTEC 28 0.25-35 MG-MCG tablet Take 1 tablet by mouth as directed.     albuterol (VENTOLIN HFA) 108 (90 Base) MCG/ACT inhaler Inhale 2 puffs into the lungs every 6 (six) hours as needed for wheezing or shortness of breath. (Patient not taking: Reported on 01/02/2021) 8 g 0   amphetamine-dextroamphetamine (ADDERALL XR) 20 MG 24 hr capsule Take 20 mg by mouth every morning. (Patient not taking: Reported on 10/03/2021)     azithromycin (ZITHROMAX Z-PAK) 250 MG tablet As directed (Patient not taking: Reported on 01/02/2021) 6 tablet 0   methocarbamol (ROBAXIN) 500 MG tablet Take 1 tablet (500 mg total) by mouth 2 (two) times daily. (Patient not taking: Reported on 10/03/2021) 20 tablet 0   progesterone (PROMETRIUM) 100 MG capsule Take 100 mg by mouth at bedtime. (Patient not taking: Reported on 10/03/2021)     No current facility-administered medications for this visit.     Allergies:   Blue dyes (parenteral), Other, Whey, Milk (cow), Corn oil, Crab extract  allergy skin test, Egg white (egg protein), Egg yolk, Gluten meal, Gramineae pollens, Monascus purpureus went yeast, Mushroom extract complex, Oat grain extract allergy skin test, Olive oil, Onion, Pineapple, Rice, Shiitake mushroom, Strawberry extract, Sugar-protein-starch, Wheat bran, and Yeast   Social History:  The patient  reports that she has never smoked. She  has never used smokeless tobacco. She reports that she does not currently use alcohol. She reports that she does not currently use drugs.   Family History:   family history includes Healthy in her father and mother.   Review of Systems: Review of Systems  Constitutional: Negative.   HENT: Negative.    Respiratory: Negative.    Cardiovascular: Negative.   Gastrointestinal: Negative.   Musculoskeletal: Negative.   Neurological:  Positive for dizziness.  Psychiatric/Behavioral: Negative.    All other systems reviewed and are negative.  PHYSICAL EXAM: VS:  BP 100/60 (BP Location: Left Arm, Patient Position: Sitting, Cuff Size: Normal)   Pulse 74   Ht 5\' 5"  (1.651 m)   Wt 126 lb 4 oz (57.3 kg)   SpO2 99%   BMI 21.01 kg/m  , BMI Body mass index is 21.01 kg/m. Constitutional:  oriented to person, place, and time. No distress.  HENT:  Head: Grossly normal Eyes:  no discharge. No scleral icterus.  Neck: No JVD, no carotid bruits  Cardiovascular: Regular rate and rhythm, no murmurs appreciated Pulmonary/Chest: Clear to auscultation bilaterally, no wheezes or rails Abdominal: Soft.  no distension.  no tenderness.  Musculoskeletal: Normal range of motion Neurological:  normal muscle tone. Coordination normal. No atrophy Skin: Skin warm and dry Psychiatric: normal affect, pleasant  Recent Labs: No results found for requested labs within last 365 days.    Lipid Panel No results found for: "CHOL", "HDL", "LDLCALC", "TRIG"    Wt Readings from Last 3 Encounters:  10/03/21 126 lb 4 oz (57.3 kg)  07/04/21 122 lb (55.3 kg)  01/02/21 122 lb 2 oz (55.4 kg)     ASSESSMENT AND PLAN:  Problem List Items Addressed This Visit       Cardiology Problems   Paroxysmal tachycardia (HCC) - Primary     Other   Anxiety   Palpitations   Chest discomfort   paroxysmal tachycardia/palpitations On Bystolic 5-10 mg daily Blood pressure low, unable to titrate Previous Holter several years  ago with PACs, PVCs, sinus tachycardia Discussed repeating a Zio monitor Will hold off for now  Orthostasis Reports symptomatic orthostasis Trouble gaining weight, states she is hydrated, compression clothing not working well Troubled by chronic fatigue, dizziness To rule out hypotension as a contributor to her symptoms, we will start Florinef 0.1 mg daily with midodrine 5 mg up to 10 mg as needed BMP in 1 month time  Allergies/motility disorder Has been seen by GI in the past Reports history of H. pylori   Total encounter time more than 30 minutes  Greater than 50% was spent in counseling and coordination of care with the patient    Signed, 01/04/21, M.D., Ph.D. [facility] [place], [place]

## 2021-10-03 ENCOUNTER — Other Ambulatory Visit: Payer: Self-pay | Admitting: Cardiovascular Disease

## 2021-10-03 ENCOUNTER — Encounter: Payer: Self-pay | Admitting: Cardiovascular Disease

## 2021-10-03 ENCOUNTER — Ambulatory Visit: Payer: BLUE CROSS/BLUE SHIELD | Attending: Cardiovascular Disease | Admitting: Cardiovascular Disease

## 2021-10-03 VITALS — BP 100/60 | HR 74 | Ht 65.0 in | Wt 126.2 lb

## 2021-10-03 DIAGNOSIS — F419 Anxiety disorder, unspecified: Secondary | ICD-10-CM

## 2021-10-03 DIAGNOSIS — R0789 Other chest pain: Secondary | ICD-10-CM

## 2021-10-03 DIAGNOSIS — I479 Paroxysmal tachycardia, unspecified: Secondary | ICD-10-CM

## 2021-10-03 DIAGNOSIS — I951 Orthostatic hypotension: Secondary | ICD-10-CM | POA: Diagnosis not present

## 2021-10-03 DIAGNOSIS — R002 Palpitations: Secondary | ICD-10-CM | POA: Diagnosis not present

## 2021-10-03 MED ORDER — FLUDROCORTISONE ACETATE 0.1 MG PO TABS: 0.1000 mg | ORAL_TABLET | Freq: Every day | ORAL | 6 refills | Status: DC

## 2021-10-03 MED ORDER — MIDODRINE HCL 10 MG PO TABS: 10.0000 mg | ORAL_TABLET | Freq: Three times a day (TID) | ORAL | 3 refills | Status: DC | PRN

## 2021-10-03 NOTE — Patient Instructions (Addendum)
Medication Instructions:  Please try florinef 0.1 mg daily Take midodrine 5 to 10 mg as needed for low pressure <100 systolic  If you need a refill on your cardiac medications before your next appointment, please call your pharmacy.   Lab work: BMP through [facility] in one month  Testing/Procedures: No new testing needed  Follow-Up: At [facility], you and your health needs are our priority.  As part of our continuing mission to provide you with exceptional heart care, we have created designated Provider Care Teams.  These Care Teams include your primary Cardiologist (physician) and Advanced Practice Providers (APPs -  Physician Assistants and Nurse Practitioners) who all work together to provide you with the care you need, when you need it.  You will need a follow up appointment in 6 months, APP ok  Providers on your designated Care Team:   Nicolasa Ducking, NP Eula Listen, PA-C Cadence Fransico [place], [place]  COVID-19 Vaccine Information can be found at: [email or web address] For questions related to vaccine distribution or appointments, please email [email or web address] or call [phone].

## 2021-10-28 ENCOUNTER — Encounter: Payer: Self-pay | Admitting: Cardiovascular Disease

## 2022-04-02 ENCOUNTER — Other Ambulatory Visit: Payer: Self-pay | Admitting: Cardiovascular Disease

## 2022-04-04 NOTE — Progress Notes (Deleted)
Cardiology Office Note  Date:  04/04/2022   ID:  Sheri [name], DOB [birth year], MRN [id number]  PCP:  [name]   No chief complaint on file.  HPI:  [name] is a 22 year-old woman with past medical history of Tachycardia palpitations Who presents for routine follow-up of her palpitations symptoms previously seen before by Sheri Kendall, MD for similar symptoms.  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  LOV August 2023 Previous trials of midodrine and Florinef unsuccessful, had weight gain, side effects  On Bystolic [id number] mg daily  Currently participating in [employer] school On her feet for long hours Having more symptoms, More head rushes, looking down to looking up Chronic fatigue Allegies worse,  Previously discussed gaining weight to help her blood pressure, having trouble gaining weight despite her best efforts Reports does not eat much for breakfast  Continues to have low blood pressures, orthostasis symptoms  Orthostatics performed today  96/50 supine, 110/60 sitting, 90/60 standing and no change after 3 minutes heart rates in the 70s  EKG personally reviewed by myself on todays visit NSR rate 74 no ST or T wave changes  Other past medical history reviewed  tachycardia symptoms started in elementary school, rare at that time middle school (2x episodes), getting worse over the years  Prior syncope Paroxysmal tach: 140-160 at rest Sx in class, has tightness, dizzy, SOB  one episode when she was at home, heart was going very fast had to lay down on the ground, almost felt paralyzed   ZIO monitor 05/2018 variable heart rate 60 up to 174 bpm Average heart rate 95 bpm There were 20 triggered events and these were associated with sinus rhythm 3 diary events fluttering racing pounding lightheadedness dizziness chest pain pressure all correlating with sinus rhythm There were APCs and PVCs noted   In terms of family history: Mother has long history of  paroxysmal tachycardia treated with bystolic daily  and propranolol as needed   Prior CV studies:   The following studies were reviewed today: Normal echo 03/27/2017 Prior outside records reviewed from [facility]   Past medical history Tachycardia, palpitations   PSH:    Past Surgical History:  Procedure Laterality Date   KNEE SURGERY Right     Current Outpatient Medications  Medication Sig Dispense Refill   albuterol (VENTOLIN HFA) 108 (90 Base) MCG/ACT inhaler Inhale 2 puffs into the lungs every 6 (six) hours as needed for wheezing or shortness of breath. (Patient not taking: Reported on 01/02/2021) 8 g 0   amphetamine-dextroamphetamine (ADDERALL XR) 20 MG 24 hr capsule Take 20 mg by mouth every morning. (Patient not taking: Reported on 10/03/2021)     azithromycin (ZITHROMAX Z-PAK) 250 MG tablet As directed (Patient not taking: Reported on 01/02/2021) 6 tablet 0   fludrocortisone (FLORINEF) 0.1 MG tablet Take 1 tablet (0.1 mg total) by mouth daily. 30 tablet 6   methocarbamol (ROBAXIN) 500 MG tablet Take 1 tablet (500 mg total) by mouth 2 (two) times daily. (Patient not taking: Reported on 10/03/2021) 20 tablet 0   midodrine (PROAMATINE) 10 MG tablet Take 1 tablet (10 mg total) by mouth 3 (three) times daily as needed (for low blood pressure). 60 tablet 3   nebivolol (BYSTOLIC) 10 MG tablet TAKE 1 TABLET DAILY 90 tablet 0   progesterone (PROMETRIUM) 100 MG capsule Take 100 mg by mouth at bedtime. (Patient not taking: Reported on 10/03/2021)     [place] 28 0.25-35 MG-MCG tablet Take 1 tablet by mouth as  directed.     No current facility-administered medications for this visit.     Allergies:   Blue dyes (parenteral), Other, Whey, Milk (cow), Corn oil, Crab extract allergy skin test, Egg white (egg protein), Egg yolk, Gluten meal, Gramineae pollens, Monascus purpureus went yeast, Mushroom extract complex, Oat grain extract allergy skin test, Olive oil, Onion, Pineapple, Rice, Shiitake  mushroom, Strawberry extract, Sugar-protein-starch, Wheat bran, and Yeast   Social History:  The patient  reports that she has never smoked. She has never used smokeless tobacco. She reports that she does not currently use alcohol. She reports that she does not currently use drugs.   Family History:   family history includes Healthy in her father and mother.   Review of Systems: Review of Systems  Constitutional: Negative.   HENT: Negative.    Respiratory: Negative.    Cardiovascular: Negative.   Gastrointestinal: Negative.   Musculoskeletal: Negative.   Neurological:  Positive for dizziness.  Psychiatric/Behavioral: Negative.    All other systems reviewed and are negative.  PHYSICAL EXAM: VS:  There were no vitals taken for this visit. , BMI There is no height or weight on file to calculate BMI. Constitutional:  oriented to person, place, and time. No distress.  HENT:  Head: Grossly normal Eyes:  no discharge. No scleral icterus.  Neck: No JVD, no carotid bruits  Cardiovascular: Regular rate and rhythm, no murmurs appreciated Pulmonary/Chest: Clear to auscultation bilaterally, no wheezes or rails Abdominal: Soft.  no distension.  no tenderness.  Musculoskeletal: Normal range of motion Neurological:  normal muscle tone. Coordination normal. No atrophy Skin: Skin warm and dry Psychiatric: normal affect, pleasant  Recent Labs: No results found for requested labs within last 365 days.    Lipid Panel No results found for: "CHOL", "HDL", "LDLCALC", "TRIG"    Wt Readings from Last 3 Encounters:  10/03/21 126 lb 4 oz (57.3 kg)  07/04/21 122 lb (55.3 kg)  01/02/21 122 lb 2 oz (55.4 kg)     ASSESSMENT AND PLAN:  Problem List Items Addressed This Visit   None paroxysmal tachycardia/palpitations On Bystolic [id number] mg daily Blood pressure low, unable to titrate Previous Holter several years ago with PACs, PVCs, sinus tachycardia Discussed repeating a Zio monitor Will hold  off for now  Orthostasis Reports symptomatic orthostasis Trouble gaining weight, states she is hydrated, compression clothing not working well Troubled by chronic fatigue, dizziness To rule out hypotension as a contributor to her symptoms, we will start Florinef 0.1 mg daily with midodrine 5 mg up to 10 mg as needed BMP in 1 month time  Allergies/motility disorder Has been seen by GI in the past Reports history of H. pylori   Total encounter time more than 30 minutes  Greater than 50% was spent in counseling and coordination of care with the patient    Signed, Esmond Plants, M.D., Ph.D. [place], [place]

## 2022-04-06 ENCOUNTER — Ambulatory Visit: Payer: BLUE CROSS/BLUE SHIELD | Admitting: Cardiovascular Disease

## 2022-06-17 NOTE — Progress Notes (Deleted)
Cardiology Office Note  Date:  06/17/2022   ID:  Sheri [name], DOB 27-Feb-2000, MRN [id number]  PCP:  [name]   No chief complaint on file.  HPI:  [name] is a 22 year old woman with past medical history of Tachycardia palpitations Who presents for routine follow-up of her palpitations symptoms previously seen before by Sheri Essex, MD for similar symptoms.  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  LOV August 2023 On Bystolic 5-10 mg daily  Recent sinusitis  Currently participating in [employer] school On her feet for long hours Having more symptoms, More head rushes, looking down to looking up Chronic fatigue Allegies worse,  Previously discussed gaining weight to help her blood pressure, having trouble gaining weight despite her best efforts Reports does not eat much for breakfast  Continues to have low blood pressures, orthostasis symptoms  Orthostatics performed today  96/50 supine, 110/60 sitting, 90/60 standing and no change after 3 minutes heart rates in the 70s  EKG personally reviewed by myself on todays visit NSR rate 74 no ST or T wave changes  Other past medical history reviewed  tachycardia symptoms started in elementary school, rare at that time middle school (2x episodes), getting worse over the years  Prior syncope Paroxysmal tach: 140-160 at rest Sx in class, has tightness, dizzy, SOB  one episode when she was at home, heart was going very fast had to lay down on the ground, almost felt paralyzed   ZIO monitor 05/2018 variable heart rate 60 up to 174 bpm Average heart rate 95 bpm There were 20 triggered events and these were associated with sinus rhythm 3 diary events fluttering racing pounding lightheadedness dizziness chest pain pressure all correlating with sinus rhythm There were APCs and PVCs noted   In terms of family history: Mother has long history of paroxysmal tachycardia treated with bystolic daily  and propranolol as  needed   Prior CV studies:   The following studies were reviewed today: Normal echo 03/27/2017 Prior outside records reviewed from [facility]   Past medical history Tachycardia, palpitations   PSH:    Past Surgical History:  Procedure Laterality Date   KNEE SURGERY Right     Current Outpatient Medications  Medication Sig Dispense Refill   albuterol (VENTOLIN HFA) 108 (90 Base) MCG/ACT inhaler Inhale 2 puffs into the lungs every 6 (six) hours as needed for wheezing or shortness of breath. (Patient not taking: Reported on 01/02/2021) 8 g 0   amphetamine-dextroamphetamine (ADDERALL XR) 20 MG 24 hr capsule Take 20 mg by mouth every morning. (Patient not taking: Reported on 10/03/2021)     azithromycin (ZITHROMAX Z-PAK) 250 MG tablet As directed (Patient not taking: Reported on 01/02/2021) 6 tablet 0   fludrocortisone (FLORINEF) 0.1 MG tablet Take 1 tablet (0.1 mg total) by mouth daily. 30 tablet 6   methocarbamol (ROBAXIN) 500 MG tablet Take 1 tablet (500 mg total) by mouth 2 (two) times daily. (Patient not taking: Reported on 10/03/2021) 20 tablet 0   midodrine (PROAMATINE) 10 MG tablet Take 1 tablet (10 mg total) by mouth 3 (three) times daily as needed (for low blood pressure). 60 tablet 3   nebivolol (BYSTOLIC) 10 MG tablet TAKE 1 TABLET DAILY 90 tablet 0   progesterone (PROMETRIUM) 100 MG capsule Take 100 mg by mouth at bedtime. (Patient not taking: Reported on 10/03/2021)     [place] 28 0.25-35 MG-MCG tablet Take 1 tablet by mouth as directed.     No current facility-administered medications for this  visit.     Allergies:   Blue dyes (parenteral), Other, Whey, Milk (cow), Corn oil, Crab extract, Egg white (egg protein), Egg yolk, Gluten meal, Gramineae pollens, Monascus purpureus went yeast, Mushroom extract complex, Oat grain extract allergy skin test, Olive oil, Onion, Pineapple, Rice, Shiitake mushroom, Strawberry extract, Sugar-protein-starch, Wheat, and Yeast   Social History:   The patient  reports that she has never smoked. She has never used smokeless tobacco. She reports that she does not currently use alcohol. She reports that she does not currently use drugs.   Family History:   family history includes Healthy in her father and mother.   Review of Systems: Review of Systems  Constitutional: Negative.   HENT: Negative.    Respiratory: Negative.    Cardiovascular: Negative.   Gastrointestinal: Negative.   Musculoskeletal: Negative.   Neurological:  Positive for dizziness.  Psychiatric/Behavioral: Negative.    All other systems reviewed and are negative.  PHYSICAL EXAM: VS:  There were no vitals taken for this visit. , BMI There is no height or weight on file to calculate BMI. Constitutional:  oriented to person, place, and time. No distress.  HENT:  Head: Grossly normal Eyes:  no discharge. No scleral icterus.  Neck: No JVD, no carotid bruits  Cardiovascular: Regular rate and rhythm, no murmurs appreciated Pulmonary/Chest: Clear to auscultation bilaterally, no wheezes or rails Abdominal: Soft.  no distension.  no tenderness.  Musculoskeletal: Normal range of motion Neurological:  normal muscle tone. Coordination normal. No atrophy Skin: Skin warm and dry Psychiatric: normal affect, pleasant  Recent Labs: No results found for requested labs within last 365 days.    Lipid Panel No results found for: "CHOL", "HDL", "LDLCALC", "TRIG"    Wt Readings from Last 3 Encounters:  10/03/21 126 lb 4 oz (57.3 kg)  07/04/21 122 lb (55.3 kg)  01/02/21 122 lb 2 oz (55.4 kg)     ASSESSMENT AND PLAN:  Problem List Items Addressed This Visit   None paroxysmal tachycardia/palpitations On Bystolic 5-10 mg daily Blood pressure low, unable to titrate Previous Holter several years ago with PACs, PVCs, sinus tachycardia Discussed repeating a Zio monitor Will hold off for now  Orthostasis Reports symptomatic orthostasis Trouble gaining weight, states she  is hydrated, compression clothing not working well Troubled by chronic fatigue, dizziness To rule out hypotension as a contributor to her symptoms, we will start Florinef 0.1 mg daily with midodrine 5 mg up to 10 mg as needed BMP in 1 month time  Allergies/motility disorder Has been seen by GI in the past Reports history of H. pylori   Total encounter time more than 30 minutes  Greater than 50% was spent in counseling and coordination of care with the patient    Signed, Dossie Arbour, M.D., Ph.D. [facility] [place], [place] [phone]

## 2022-06-18 ENCOUNTER — Ambulatory Visit: Payer: BLUE CROSS/BLUE SHIELD | Admitting: Cardiovascular Disease

## 2022-06-18 DIAGNOSIS — R002 Palpitations: Secondary | ICD-10-CM

## 2022-06-18 DIAGNOSIS — I479 Paroxysmal tachycardia, unspecified: Secondary | ICD-10-CM

## 2022-06-18 DIAGNOSIS — I951 Orthostatic hypotension: Secondary | ICD-10-CM

## 2022-06-18 DIAGNOSIS — R0789 Other chest pain: Secondary | ICD-10-CM

## 2022-07-02 ENCOUNTER — Other Ambulatory Visit: Payer: Self-pay | Admitting: Cardiovascular Disease

## 2022-07-02 ENCOUNTER — Ambulatory Visit
Admission: RE | Admit: 2022-07-02 | Discharge: 2022-07-02 | Disposition: A | Discharge status: Home / Self Care | Payer: BLUE CROSS/BLUE SHIELD | Source: Ambulatory Visit | Attending: Family Medicine | Admitting: Family Medicine

## 2022-07-02 VITALS — BP 102/59 | HR 72 | Temp 98.4°F | Resp 16

## 2022-07-02 DIAGNOSIS — S61411A Laceration without foreign body of right hand, initial encounter: Secondary | ICD-10-CM | POA: Diagnosis not present

## 2022-07-02 MED ORDER — CEPHALEXIN 500 MG PO CAPS: 500.0000 mg | ORAL_CAPSULE | Freq: Three times a day (TID) | ORAL | 0 refills | Status: AC

## 2022-07-02 MED ORDER — TETANUS-DIPHTH-ACELL PERTUSSIS 5-2.5-18.5 LF-MCG/0.5 IM SUSY
0.5000 mL | PREFILLED_SYRINGE | Freq: Once | INTRAMUSCULAR | Status: AC
  Administered 2022-07-02: 0.5 mL via INTRAMUSCULAR

## 2022-07-02 NOTE — ED Provider Notes (Signed)
[facility]    CSN: [id number] Arrival date & time: 07/02/22  1702      History   Chief Complaint Chief Complaint  Patient presents with   Laceration    On palm of hand, cut with piece of glass - Entered by patient    HPI Sheri [name] is a 22 y.o. female.   HPI  Sheri [name] presents for right hand laceration that occurred Saturday morning at 01:30 AM.  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 The area keeps opening back up and is painful.  She is right handed.   History reviewed. No pertinent past medical history.  Patient Active Problem List   Diagnosis Date Noted   Anxiety 07/20/2018   Paroxysmal tachycardia (HCC) 05/05/2018   Palpitations 05/05/2018   Chest discomfort 05/05/2018    Past Surgical History:  Procedure Laterality Date   KNEE SURGERY Right     OB History   No obstetric history on file.      Home Medications    Prior to Admission medications   Medication Sig Start Date End Date Taking? Authorizing Provider  cephALEXin (KEFLEX) 500 MG capsule Take 1 capsule (500 mg total) by mouth 3 (three) times daily. 07/02/22  Yes Simon Llamas, DO  fludrocortisone (FLORINEF) 0.1 MG tablet Take 1 tablet (0.1 mg total) by mouth daily. 10/03/21   Antonieta Iba, MD  midodrine (PROAMATINE) 10 MG tablet Take 1 tablet (10 mg total) by mouth 3 (three) times daily as needed (for low blood pressure). 10/03/21   Antonieta Iba, MD  nebivolol (BYSTOLIC) 10 MG tablet TAKE 1 TABLET DAILY 04/02/22   Antonieta Iba, MD  SPRINTEC 28 0.25-35 MG-MCG tablet Take 1 tablet by mouth as directed. 06/27/18   [provider]    Family History Family History  Problem Relation Age of Onset   Healthy Mother    Healthy Father     Social History Social History   Tobacco Use   Smoking status: Never   Smokeless tobacco: Never  Vaping Use   Vaping Use: Never used  Substance Use Topics   Alcohol use: Yes   Drug use: Not Currently      Allergies   Blue dyes (parenteral), Other, Whey, Milk (cow), Corn oil, Crab extract, Egg white (egg protein), Egg yolk, Gluten meal, Gramineae pollens, Monascus purpureus went yeast, Mushroom extract complex, Oat grain extract allergy skin test, Olive oil, Onion, Pineapple, Rice, Shiitake mushroom, Strawberry extract, Sugar-protein-starch, Wheat, and Yeast   Review of Systems Review of Systems :negative unless otherwise stated in HPI.      Physical Exam Triage Vital Signs ED Triage Vitals  Enc Vitals Group     BP 07/02/22 1723 (!) 102/59     Pulse Rate 07/02/22 1723 72     Resp 07/02/22 1723 16     Temp 07/02/22 1723 98.4 F (36.9 C)     Temp Source 07/02/22 1723 Oral     SpO2 07/02/22 1723 99 %     Weight --      Height --      Head Circumference --      Peak Flow --      Pain Score 07/02/22 1721 0     Pain Loc --      Pain Edu? --      Excl. in GC? --    No data found.  Updated Vital Signs BP (!) 102/59 (BP Location: Left Arm)  Pulse 72   Temp 98.4 F (36.9 C) (Oral)   Resp 16   LMP 06/26/2022   SpO2 99%   Visual Acuity Right Eye Distance:   Left Eye Distance:   Bilateral Distance:    Right Eye Near:   Left Eye Near:    Bilateral Near:     Physical Exam  GEN: alert, well appearing female, in no acute distress  EYES: no scleral injection CV: regular rate, strong radial pulse  RESP: no increased work of breathing MSK: normal ROM of right wrist, thumb and fingers NEURO: alert, moves all extremities appropriately, normal gait PSYCH: Normal affect, appropriate speech and behavior  SKIN: warm and dry; 1 cm lateral palmer crease laceration with surrounding erythema and crusting      UC Treatments / Results  Labs (all labs ordered are listed, but only abnormal results are displayed) Labs Reviewed - No data to display  EKG   Radiology No results found.  Procedures Procedures (including critical care time)  Medications Ordered in  UC Medications  Tdap (BOOSTRIX) injection 0.5 mL (0.5 mLs Intramuscular Given 07/02/22 1737)    Initial Impression / Assessment and Plan / UC Course  I have reviewed the triage vital signs and the nursing notes.  Pertinent labs & imaging results that were available during my care of the patient were reviewed by me and considered in my medical decision making (see chart for details).     Patient is a 22 y.o. femalewho presents for right hand laceration that occurred >48 hours ago.  Overall, patient is well-appearing and well-hydrated.  Vital signs stable.  Destinye is afebrile. Start antibiotics to prevent cellulitis. Unfortunately, it is not recommended to close her hand wound at this time. She has 1 cm lateral palmer crease laceration. Wound to heal by secondary intention. Tetanus updated prior to discharge.   Reviewed expectations regarding course of current medical issues.  All questions asked were answered.  Outlined signs and symptoms indicating need for more acute intervention. Patient verbalized understanding. After Visit Summary given.   Final Clinical Impressions(s) / UC Diagnoses   Final diagnoses:  Laceration of right hand, foreign body presence unspecified, initial encounter     Discharge Instructions      Unfortunately, I am unable to close your skin. Take the antibiotics to prevent infection. Keep your hands dry as possible. A tetanus vaccine was given today, you'll need a new in May 2034.     ED Prescriptions     Medication Sig Dispense Auth. Provider   cephALEXin (KEFLEX) 500 MG capsule Take 1 capsule (500 mg total) by mouth 3 (three) times daily. 21 capsule Katha Cabal, DO      PDMP not reviewed this encounter.              Katha Cabal, DO 07/02/22 1743

## 2022-07-02 NOTE — Discharge Instructions (Addendum)
Unfortunately, I am unable to close your skin. Take the antibiotics to prevent infection. Keep your hands dry as possible. A tetanus vaccine was given today, you'll need a new in May 2034.   Call  Dr Donnald Garre a hand specialist for your hand wound.  She likely can see your at [facility] in [place].

## 2022-07-02 NOTE — ED Triage Notes (Signed)
Pt cut her right hand on a piece of glass 3 days ago. With movement the laceration keeps opening up.

## 2022-09-23 NOTE — Progress Notes (Unsigned)
cardiology Office Note  Date:  09/24/2022   ID:  SHAQUERA SANDSTEDT, DOB 2000/09/30, MRN [id number]  PCP:  [facility]   Chief Complaint  Patient presents with   Follow-up    Pt feels well today. Meds reviewed.   HPI:  Ms. Elwood Kahana is a 22 year old woman with past medical history of Tachycardia palpitations Who presents for routine follow-up of her palpitations symptoms previously seen before by Caleen Essex, MD for similar symptoms.  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 [employer] Also working at [employer]  in [place] On her feet for long hours at a time  Does not eat a lot Food allergy an issue POTS little better Less lightheaded  On Bystolic 10 mg daily Not on propranolol , drops pressure Occasionally will have episodes of "Heavy heart beat", typically resolves without intervention, rate is not faster just feels strong  Weight gain on florinef, stopped the medication Midodrine felt hot, tremor, stopped the medication  EKG personally reviewed by myself on todays visit EKG Interpretation Date/Time:  Monday September 24 2022 10:45:01 EDT Ventricular Rate:  65 PR Interval:  120 QRS Duration:  94 QT Interval:  416 QTC Calculation: 432 R Axis:   94  Text Interpretation: Normal sinus rhythm When compared with ECG of 13-Jul-2018 16:30, No significant change was found Confirmed by Julien Nordmann [phone]) on 09/24/2022 11:05:52 AM   Other past medical history reviewed  tachycardia symptoms started in elementary school, rare at that time middle school (2x episodes), getting worse over the years  Prior syncope Paroxysmal tach: 140-160 at rest Sx in class, has tightness, dizzy, SOB  one episode when she was at home, heart was going very fast had to lay down on the ground, almost felt paralyzed   ZIO monitor 05/2018 variable heart rate 60 up to 174 bpm Average heart rate 95  bpm There were 20 triggered events and these were associated with sinus rhythm 3 diary events fluttering racing pounding lightheadedness dizziness chest pain pressure all correlating with sinus rhythm There were APCs and PVCs noted   In terms of family history: Mother has long history of paroxysmal tachycardia treated with bystolic daily  and propranolol as needed   Prior CV studies:   The following studies were reviewed today: Normal echo 03/27/2017 Prior outside records reviewed from [facility]   Past medical history Tachycardia, palpitations   PSH:    Past Surgical History:  Procedure Laterality Date   KNEE SURGERY Right     Current Outpatient Medications  Medication Sig Dispense Refill   nebivolol (BYSTOLIC) 10 MG tablet TAKE 1 TABLET DAILY 60 tablet 1   SPRINTEC 28 0.25-35 MG-MCG tablet Take 1 tablet by mouth as directed.     cephALEXin (KEFLEX) 500 MG capsule Take 1 capsule (500 mg total) by mouth 3 (three) times daily. (Patient not taking: Reported on 09/24/2022) 21 capsule 0   fludrocortisone (FLORINEF) 0.1 MG tablet Take 1 tablet (0.1 mg total) by mouth daily. (Patient not taking: Reported on 09/24/2022) 30 tablet 6   midodrine (PROAMATINE) 10 MG tablet Take 1 tablet (10 mg total) by mouth 3 (three) times daily as needed (for low blood pressure). (Patient not taking: Reported on 09/24/2022) 60 tablet 3   No current facility-administered medications for this visit.    Allergies:   Blue dyes (parenteral), Other, Whey, Milk (cow), Corn oil, Crab extract, Egg white (egg protein), Egg  yolk, Gluten meal, Gramineae pollens, Monascus purpureus went yeast, Mushroom extract complex, Oat grain extract allergy skin test, Olive oil, Onion, Pineapple, Rice, Shiitake mushroom, Strawberry extract, Sugar-protein-starch, Wheat, and Yeast   Social History:  The patient  reports that she has never smoked. She has never used smokeless tobacco. She reports current alcohol use. She reports that  she does not currently use drugs.   Family History:   family history includes Healthy in her father and mother.   Review of Systems: Review of Systems  Constitutional: Negative.   HENT: Negative.    Respiratory: Negative.    Cardiovascular: Negative.   Gastrointestinal: Negative.   Musculoskeletal: Negative.   Neurological:  Positive for dizziness.  Psychiatric/Behavioral: Negative.    All other systems reviewed and are negative.  PHYSICAL EXAM: VS:  BP 112/60 (BP Location: Left Arm, Patient Position: Sitting, Cuff Size: Normal)   Pulse 65   Ht 5\' 5"  (1.651 m)   Wt 119 lb (54 kg)   SpO2 99%   BMI 19.80 kg/m  , BMI Body mass index is 19.8 kg/m. Constitutional:  oriented to person, place, and time. No distress.  HENT:  Head: Grossly normal Eyes:  no discharge. No scleral icterus.  Neck: No JVD, no carotid bruits  Cardiovascular: Regular rate and rhythm, no murmurs appreciated Pulmonary/Chest: Clear to auscultation bilaterally, no wheezes or rails Abdominal: Soft.  no distension.  no tenderness.  Musculoskeletal: Normal range of motion Neurological:  normal muscle tone. Coordination normal. No atrophy Skin: Skin warm and dry Psychiatric: normal affect, pleasant  Recent Labs: No results found for requested labs within last 365 days.    Lipid Panel No results found for: "CHOL", "HDL", "LDLCALC", "TRIG"    Wt Readings from Last 3 Encounters:  09/24/22 119 lb (54 kg)  10/03/21 126 lb 4 oz (57.3 kg)  07/04/21 122 lb (55.3 kg)     ASSESSMENT AND PLAN:  Problem List Items Addressed This Visit       Cardiology Problems   Paroxysmal tachycardia (HCC) - Primary   Relevant Orders   EKG 12-Lead (Completed)     Other   Anxiety   Palpitations   Relevant Orders   EKG 12-Lead (Completed)   Chest discomfort   Other Visit Diagnoses     Orthostasis           paroxysmal tachycardia/palpitations On Bystolic 10 mg daily, seems to be controlling symptoms Not on  propranolol secondary to drop in pressure  Orthostasis Stable orthostasis symptoms, Staying hydrated, weight stable 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 Pylori Reports that chiropractic helping with allergies   Total encounter time more than 30 minutes  Greater than 50% was spent in counseling and coordination of care with the patient    Signed, Dossie Arbour, M.D., Ph.D. [facility] [place], [place] [phone]

## 2022-09-24 ENCOUNTER — Encounter: Payer: Self-pay | Admitting: Cardiovascular Disease

## 2022-09-24 ENCOUNTER — Ambulatory Visit: Payer: BLUE CROSS/BLUE SHIELD | Attending: Cardiovascular Disease | Admitting: Cardiovascular Disease

## 2022-09-24 VITALS — BP 112/60 | HR 65 | Ht 65.0 in | Wt 119.0 lb

## 2022-09-24 DIAGNOSIS — F419 Anxiety disorder, unspecified: Secondary | ICD-10-CM | POA: Diagnosis not present

## 2022-09-24 DIAGNOSIS — I479 Paroxysmal tachycardia, unspecified: Secondary | ICD-10-CM | POA: Diagnosis not present

## 2022-09-24 DIAGNOSIS — R002 Palpitations: Secondary | ICD-10-CM | POA: Diagnosis not present

## 2022-09-24 DIAGNOSIS — I951 Orthostatic hypotension: Secondary | ICD-10-CM

## 2022-09-24 DIAGNOSIS — R0789 Other chest pain: Secondary | ICD-10-CM

## 2022-09-24 MED ORDER — NEBIVOLOL HCL 10 MG PO TABS: 10.0000 mg | ORAL_TABLET | Freq: Every day | ORAL | 3 refills | Status: DC

## 2022-09-24 NOTE — Patient Instructions (Signed)

## 2023-09-25 NOTE — Progress Notes (Unsigned)
 Cardiology Clinic Note   Date: 09/27/2023 ID: AVE SCHARNHORST, DOB May 15, 2000, MRN [id number]  Primary Cardiologist:  Evalene Lunger, MD  Chief Complaint   Sheri [name] is a 23 y.o. female who presents to the clinic today for routine follow up.   Patient Profile   Sheri [name] is followed by Dr. Gollan for the history outlined below.      Past medical history significant for: Palpitations/paroxysmal tachycardia. 7-day Zio 05/30/2018: HR 54 to 170 bpm, average 88 bpm.  Predominantly sinus rhythm.  Rare ectopy.  Patient triggered events not associated with significant arrhythmia, symptoms associated with sinus tachycardia rates 90 to 100 bpm rarely 120 to 150 bpm.  In summary, patient was first evaluated by Dr. Gollan via televisit on 05/05/2022 palpitations.  Patient reported tachycardic symptoms starting in elementary school and progressively worsening over the years with occurrences 1-2 times per week.  Heart rate can get as high as 140-160 at rest with chest tightness, dizziness and shortness of breath.  She wore a heart monitor in 2019 which demonstrated variable heart rate 60 to 174 bpm, average 95 bpm.  She reported adequate hydration.  Mother with similar symptoms managed on Bystolic  and as needed propranolol .  Patient was started on propranolol  3 times daily as needed.  Repeat ZIO monitor demonstrated heart rate 54 to 170 bpm, average 88 bpm.  She was started on daily Bystolic .  She followed up in the office in July 2020 and was noted to be orthostatic.  She felt palpitations were better managed on Bystolic  10 mg versus 5 mg.  She was unable to take propranolol  secondary to hypotension.  Abdominal binder was recommended.  In August 2023 patient reported symptomatic orthostasis.  She was started on Florinef  0.1 mg daily with midodrine  5 mg up to 10 mg as needed for hypotension.    Patient was last seen in the office by Dr. Gollan on 09/24/2022.  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  No medication changes were made.     History of Present Illness    Today, patient reports tachycardia is overall unchanged. She continues to have palpitations and episodes of lightheadedness/dizziness. She was evaluated by the [facility] POTS [facility] in March. They diagnosed her IST and wanted to do testing for POTS. Unfortunately, her insurance company denied covering these test even in appeal. She is not sure if she should go back to the clinic. She reports 2 near syncopal episodes this year occurring at work. She stated she became lightheaded and presyncopal. She sat down and her arms and legs turned purple and her hands began drawing up. Her coworkers assisted her to drink water and episode passed. Discussed trying to get pictures of her episodes so she can send them to insurance to try to get testing approved.  She reports compression has not worked for her in the past. She does liberalize sodium. She admits that she is likely not drinking enough. 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 She states she stays so busy that there are days she does not get to eat until late in the afternoon. She has gone to a hydration station when she feels dehydrated to get IV fluids. She exercises regularly performing cardio and weight lifting. She also reports raynaud's symptoms particularly in her feet with her toes turning white  and becoming numb. She has not mentioned this to her PCP, as she has moved to [facility] and is looking for a local provider.       ROS: All other systems reviewed and are otherwise negative except as noted in History of Present Illness.  EKGs/Labs Reviewed    EKG Interpretation Date/Time:  Friday September 27 2023 14:01:24 EDT Ventricular Rate:  71 PR Interval:  126 QRS Duration:  94 QT Interval:  376 QTC Calculation: 408 R Axis:   92  Text  Interpretation: Normal sinus rhythm When compared with ECG of 24-Sep-2022 10:45, No significant change was found Confirmed by Loistine Sober [phone]) on 09/27/2023 2:07:52 PM        Physical Exam    VS:  BP 104/62   Pulse 71   Ht 5' 5 (1.651 m)   Wt 125 lb 3.2 oz (56.8 kg)   SpO2 92%   BMI 20.83 kg/m  , BMI Body mass index is 20.83 kg/m.  GEN: Well nourished, well developed, in no acute distress. Neck: No JVD or carotid bruits. Cardiac:  RRR.  No murmur. No rubs or gallops.   Respiratory:  Respirations regular and unlabored. Clear to auscultation without rales, wheezing or rhonchi. GI: Soft, nontender, nondistended. Extremities: Radials/DP/PT 2+ and equal bilaterally. No clubbing or cyanosis. No edema.  Skin: Warm and dry, no rash. Neuro: Strength intact.  Assessment & Plan   Palpitations/paroxysmal tachycardia 7-day ZIO April 2020 demonstrated HR 54 to 170 bpm, average 88 bpm, predominantly sinus rhythm, rare ectopy, patient triggered events associated with sinus tachycardia.  Overall she feels symptoms are unchanged. She continues to have palpitations and episodes of lightheadedness. She had 2 episodes of near syncope this year occurring at work. She became lightheaded and needed to sit down. Her arms and legs turned purple, she was diaphoretic and her hands were drawing up. Her coworkers assisted her to drink water and episode passed. She does liberalize salt but admits she does not drink enough fluid indicating she drinks one bottle of water a day. 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 She will go to a hydration station for IV fluids when she feels dehydrated.  She was evaluated by the [facility] POTS clinic in March and was to undergo further testing for POTS. Unfortunately insurance denied the testing even in appeal. She is unsure if she should return to the POTS clinic. Encouraged her to do so. Her mother wanted her to ask if she should be on  Corlanor for her symptoms. She feels she is well controlled on the Bystolic  and is hesitant to make a change. Discussed this medication tends to be expensive. We can always try to send it and see how it is covered by her insurance. She would like to hold off for now. She is encouraged to contact the office if she changes her mind.  - Educated patient on increasing hydration. Discussed that IV fluids is not recommendation as a way to hydrate with POTS. Provided further education on AVS for patient.  - Encouraged follow up with [facility] POTS clinic.  - Continue Bystolic . - CBC, BMP, TSH, Mg today.   Hypotension BP today 104/62.  - Continue to monitor.   Disposition: CBC, BMP, TSH, Mg today. Return in 1 year or sooner as needed.          Signed, Sober HERO. Shanea Karney, DNP, NP-C

## 2023-09-27 ENCOUNTER — Ambulatory Visit: Attending: Student | Admitting: Student

## 2023-09-27 ENCOUNTER — Encounter: Payer: Self-pay | Admitting: Student

## 2023-09-27 VITALS — BP 104/62 | HR 71 | Ht 65.0 in | Wt 125.2 lb

## 2023-09-27 DIAGNOSIS — I959 Hypotension, unspecified: Secondary | ICD-10-CM | POA: Diagnosis not present

## 2023-09-27 DIAGNOSIS — I479 Paroxysmal tachycardia, unspecified: Secondary | ICD-10-CM | POA: Diagnosis not present

## 2023-09-27 DIAGNOSIS — R002 Palpitations: Secondary | ICD-10-CM | POA: Diagnosis not present

## 2023-09-27 NOTE — Patient Instructions (Signed)
 Medication Instructions:  Your physician recommends that you continue on your current medications as directed. Please refer to the Current Medication list given to you today.   *If you need a refill on your cardiac medications before your next appointment, please call your pharmacy*  Lab Work: Your provider would like for you to have following labs drawn today BMet, CBC, Magnesium, TSH.   If you have labs (blood work) drawn today and your tests are completely normal, you will receive your results only by: MyChart Message (if you have MyChart) OR A paper copy in the mail If you have any lab test that is abnormal or we need to change your treatment, we will call you to review the results.  Testing/Procedures: None ordered at this time    Follow-Up: At [facility], you and your health needs are our priority.  As part of our continuing mission to provide you with exceptional heart care, our providers are all part of one team.  This team includes your primary Cardiologist (physician) and Advanced Practice Providers or APPs (Physician Assistants and Nurse Practitioners) who all work together to provide you with the care you need, when you need it.  Your next appointment:   1 year(s)  Provider:   Evalene Lunger, MD or Barnie Hila, NP    We recommend signing up for the patient portal called MyChart.  Sign up information is provided on this After Visit Summary.  MyChart is used to connect with patients for Virtual Visits (Telemedicine).  Patients are able to view lab/test results, encounter notes, upcoming appointments, etc.  Non-urgent messages can be sent to your provider as well.   To learn more about what you can do with MyChart, go to [email or web address].   Other Instructions  -avoid dehydration. Often it requires high volumes of fluids, often with salt/electrolytes included, to stay hydrated. People with POTS are very sensitive to fluid shifts and dehydration. Oral  rehydration is preferred, and routine use of IV fluids is not recommended. -if tolerated, compression garments can assist with fluid management and prevent blood pooling. Compression stockings are a start, but some people need thigh high compression stockings and abdominal compression to help manage symptoms. -slow position changes are recommended -if there is a feeling of severe lightheadedness, like near to passing out, recommend lying on the floor on the back, with legs elevated up on a chair or up against the wall. -the best long term management of POTS symptoms is gradual exercise conditioning. I recommend seated exercises such as bike to start, to avoid the risk of falling with lightheadedness. Exercise programs, either through supervised programs like cardiac rehab or through personal programs, should focus on gradually increasing exercise tolerance and conditioning. -this is a link to specific exercise recommendations for POTS: [email or web address]

## 2023-09-28 ENCOUNTER — Ambulatory Visit: Payer: Self-pay | Admitting: Student

## 2023-09-28 LAB — CBC
Hematocrit: 36.8 % (ref 34.0–46.6)
Hemoglobin: 12.1 g/dL (ref 11.1–15.9)
MCH: 30.6 pg (ref 26.6–33.0)
MCHC: 32.9 g/dL (ref 31.5–35.7)
MCV: 93 fL (ref 79–97)
Platelets: 204 x10E3/uL (ref 150–450)
RBC: 3.95 x10E6/uL (ref 3.77–5.28)
RDW: 12 % (ref 11.7–15.4)
WBC: 4.6 x10E3/uL (ref 3.4–10.8)

## 2023-09-28 LAB — BASIC METABOLIC PANEL WITH GFR
BUN/Creatinine Ratio: 10 (ref 9–23)
BUN: 7 mg/dL (ref 6–20)
CO2: 21 mmol/L (ref 20–29)
Calcium: 9.2 mg/dL (ref 8.7–10.2)
Chloride: 105 mmol/L (ref 96–106)
Creatinine, Ser: 0.73 mg/dL (ref 0.57–1.00)
Glucose: 99 mg/dL (ref 70–99)
Potassium: 4.4 mmol/L (ref 3.5–5.2)
Sodium: 141 mmol/L (ref 134–144)
eGFR: 118 mL/min/1.73 (ref >59)

## 2023-09-28 LAB — TSH: TSH: 1.16 u[IU]/mL (ref 0.450–4.500)

## 2023-09-28 LAB — MAGNESIUM: Magnesium: 2 mg/dL (ref 1.6–2.3)

## 2023-10-04 ENCOUNTER — Other Ambulatory Visit: Payer: Self-pay | Admitting: Cardiovascular Disease
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
